# Patient Record
Sex: Female | Born: 2000 | Race: Asian | Hispanic: No | Marital: Single | Smoking: Never smoker
Health system: Southern US, Community
[De-identification: ages and names within clinical notes are randomized; demographics above are authoritative.]

## PROBLEM LIST (undated history)

## (undated) DIAGNOSIS — R51 Headache: Secondary | ICD-10-CM

## (undated) DIAGNOSIS — N390 Urinary tract infection, site not specified: Secondary | ICD-10-CM

## (undated) DIAGNOSIS — S92401A Displaced unspecified fracture of right great toe, initial encounter for closed fracture: Secondary | ICD-10-CM

## (undated) DIAGNOSIS — K219 Gastro-esophageal reflux disease without esophagitis: Secondary | ICD-10-CM

## (undated) DIAGNOSIS — Q673 Plagiocephaly: Secondary | ICD-10-CM

## (undated) DIAGNOSIS — E739 Lactose intolerance, unspecified: Secondary | ICD-10-CM

## (undated) DIAGNOSIS — B019 Varicella without complication: Secondary | ICD-10-CM

## (undated) HISTORY — DX: Headache: R51

## (undated) HISTORY — DX: Displaced unspecified fracture of right great toe, initial encounter for closed fracture: S92.401A

## (undated) HISTORY — DX: Gastro-esophageal reflux disease without esophagitis: K21.9

## (undated) HISTORY — DX: Lactose intolerance, unspecified: E73.9

## (undated) HISTORY — DX: Urinary tract infection, site not specified: N39.0

## (undated) HISTORY — DX: Varicella without complication: B01.9

## (undated) HISTORY — DX: Plagiocephaly: Q67.3

---
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
## 2023-08-23 ENCOUNTER — Other Ambulatory Visit: Payer: Self-pay | Admitting: Registered Nurse

## 2023-08-23 ENCOUNTER — Encounter: Payer: Self-pay | Admitting: Registered Nurse

## 2023-08-23 ENCOUNTER — Ambulatory Visit
Admission: RE | Admit: 2023-08-23 | Discharge: 2023-08-23 | Disposition: A | Discharge status: Home / Self Care | Payer: Commercial Managed Care - PPO | Source: Ambulatory Visit | Attending: Registered Nurse | Admitting: Registered Nurse

## 2023-08-23 DIAGNOSIS — S0990XA Unspecified injury of head, initial encounter: Secondary | ICD-10-CM

## 2023-08-23 DIAGNOSIS — G4452 New daily persistent headache (NDPH): Secondary | ICD-10-CM
